# Patient Record
Sex: Male | Born: 1955 | Race: White | Hispanic: No | Marital: Married | Smoking: Never smoker
Health system: Southern US, Community
[De-identification: ages and names within clinical notes are randomized; demographics above are authoritative.]

## PROBLEM LIST (undated history)

## (undated) DIAGNOSIS — Z1211 Encounter for screening for malignant neoplasm of colon: Secondary | ICD-10-CM

## (undated) HISTORY — PX: TESTICLE SURGERY: SHX794

---

## 2020-08-08 ENCOUNTER — Other Ambulatory Visit: Payer: Self-pay

## 2020-08-08 ENCOUNTER — Emergency Department: Payer: BC Managed Care – PPO

## 2020-08-08 ENCOUNTER — Observation Stay: Payer: BC Managed Care – PPO

## 2020-08-08 ENCOUNTER — Observation Stay
Admission: EM | Admit: 2020-08-08 | Discharge: 2020-08-08 | Disposition: A | Discharge status: Home / Self Care | Payer: BC Managed Care – PPO | Attending: Internal Medicine | Admitting: Internal Medicine

## 2020-08-08 ENCOUNTER — Encounter: Payer: Self-pay | Admitting: Emergency Medicine

## 2020-08-08 ENCOUNTER — Observation Stay (HOSPITAL_BASED_OUTPATIENT_CLINIC_OR_DEPARTMENT_OTHER)
Admit: 2020-08-08 | Discharge: 2020-08-08 | Disposition: A | Discharge status: Home / Self Care | Payer: BC Managed Care – PPO | Attending: Internal Medicine | Admitting: Internal Medicine

## 2020-08-08 DIAGNOSIS — Z20822 Contact with and (suspected) exposure to covid-19: Secondary | ICD-10-CM | POA: Diagnosis not present

## 2020-08-08 DIAGNOSIS — R4701 Aphasia: Principal | ICD-10-CM | POA: Insufficient documentation

## 2020-08-08 DIAGNOSIS — I6389 Other cerebral infarction: Secondary | ICD-10-CM | POA: Diagnosis not present

## 2020-08-08 DIAGNOSIS — R29818 Other symptoms and signs involving the nervous system: Secondary | ICD-10-CM | POA: Diagnosis not present

## 2020-08-08 DIAGNOSIS — I639 Cerebral infarction, unspecified: Secondary | ICD-10-CM

## 2020-08-08 DIAGNOSIS — E785 Hyperlipidemia, unspecified: Secondary | ICD-10-CM | POA: Diagnosis not present

## 2020-08-08 DIAGNOSIS — E7800 Pure hypercholesterolemia, unspecified: Secondary | ICD-10-CM

## 2020-08-08 DIAGNOSIS — G459 Transient cerebral ischemic attack, unspecified: Secondary | ICD-10-CM | POA: Diagnosis not present

## 2020-08-08 LAB — CBC
HCT: 43.2 % (ref 39.0–52.0)
Hemoglobin: 14.3 g/dL (ref 13.0–17.0)
MCH: 30.4 pg (ref 26.0–34.0)
MCHC: 33.1 g/dL (ref 30.0–36.0)
MCV: 91.9 fL (ref 80.0–100.0)
Platelets: 171 10*3/uL (ref 150–400)
RBC: 4.7 MIL/uL (ref 4.22–5.81)
RDW: 12.6 % (ref 11.5–15.5)
WBC: 5.4 10*3/uL (ref 4.0–10.5)
nRBC: 0 % (ref 0.0–0.2)

## 2020-08-08 LAB — ECHOCARDIOGRAM COMPLETE
AR max vel: 1.89 cm2
AV Peak grad: 6 mmHg
Ao pk vel: 1.22 m/s
Area-P 1/2: 3.74 cm2
Height: 69 in
S' Lateral: 3.25 cm
Weight: 2480 oz

## 2020-08-08 LAB — LIPID PANEL
Cholesterol: 254 mg/dL — ABNORMAL HIGH (ref 0–200)
HDL: 75 mg/dL (ref >40)
LDL Cholesterol: 164 mg/dL — ABNORMAL HIGH (ref 0–99)
Total CHOL/HDL Ratio: 3.4 RATIO
Triglycerides: 76 mg/dL (ref <150)
VLDL: 15 mg/dL (ref 0–40)

## 2020-08-08 LAB — DIFFERENTIAL
Abs Immature Granulocytes: 0.01 10*3/uL (ref 0.00–0.07)
Basophils Absolute: 0 10*3/uL (ref 0.0–0.1)
Basophils Relative: 1 %
Eosinophils Absolute: 0.2 10*3/uL (ref 0.0–0.5)
Eosinophils Relative: 3 %
Immature Granulocytes: 0 %
Lymphocytes Relative: 31 %
Lymphs Abs: 1.7 10*3/uL (ref 0.7–4.0)
Monocytes Absolute: 0.6 10*3/uL (ref 0.1–1.0)
Monocytes Relative: 11 %
Neutro Abs: 2.9 10*3/uL (ref 1.7–7.7)
Neutrophils Relative %: 54 %

## 2020-08-08 LAB — COMPREHENSIVE METABOLIC PANEL
ALT: 21 U/L (ref 0–44)
AST: 22 U/L (ref 15–41)
Albumin: 3.9 g/dL (ref 3.5–5.0)
Alkaline Phosphatase: 65 U/L (ref 38–126)
Anion gap: 8 (ref 5–15)
BUN: 26 mg/dL — ABNORMAL HIGH (ref 8–23)
CO2: 28 mmol/L (ref 22–32)
Calcium: 8.8 mg/dL — ABNORMAL LOW (ref 8.9–10.3)
Chloride: 101 mmol/L (ref 98–111)
Creatinine, Ser: 1.09 mg/dL (ref 0.61–1.24)
GFR calc Af Amer: >60 mL/min (ref >60)
GFR calc non Af Amer: >60 mL/min (ref >60)
Glucose, Bld: 118 mg/dL — ABNORMAL HIGH (ref 70–99)
Potassium: 3.8 mmol/L (ref 3.5–5.1)
Sodium: 137 mmol/L (ref 135–145)
Total Bilirubin: 0.7 mg/dL (ref 0.3–1.2)
Total Protein: 6.3 g/dL — ABNORMAL LOW (ref 6.5–8.1)

## 2020-08-08 LAB — PROTIME-INR
INR: 0.9 (ref 0.8–1.2)
Prothrombin Time: 11.7 seconds (ref 11.4–15.2)

## 2020-08-08 LAB — ETHANOL: Alcohol, Ethyl (B): <10 mg/dL (ref <10)

## 2020-08-08 LAB — SARS CORONAVIRUS 2 BY RT PCR (HOSPITAL ORDER, PERFORMED IN ~~LOC~~ HOSPITAL LAB): SARS Coronavirus 2: NEGATIVE

## 2020-08-08 LAB — HEMOGLOBIN A1C
Hgb A1c MFr Bld: 5.7 % — ABNORMAL HIGH (ref 4.8–5.6)
Mean Plasma Glucose: 116.89 mg/dL

## 2020-08-08 LAB — APTT: aPTT: 26 seconds (ref 24–36)

## 2020-08-08 MED ORDER — ACETAMINOPHEN 650 MG RE SUPP: 650.0000 mg | RECTAL | Status: DC | PRN

## 2020-08-08 MED ORDER — ENOXAPARIN SODIUM 40 MG/0.4ML ~~LOC~~ SOLN
40.0000 mg | SUBCUTANEOUS | Status: DC
  Administered 2020-08-08: 40 mg via SUBCUTANEOUS
  Filled 2020-08-08: qty 0.4

## 2020-08-08 MED ORDER — IOHEXOL 350 MG/ML SOLN
75.0000 mL | Freq: Once | INTRAVENOUS | Status: AC | PRN
  Administered 2020-08-08: 75 mL via INTRAVENOUS

## 2020-08-08 MED ORDER — SODIUM CHLORIDE 0.9 % IV SOLN: INTRAVENOUS | Status: DC

## 2020-08-08 MED ORDER — ATORVASTATIN CALCIUM 20 MG PO TABS
80.0000 mg | ORAL_TABLET | Freq: Every day | ORAL | Status: DC
  Administered 2020-08-08: 80 mg via ORAL
  Filled 2020-08-08: qty 4

## 2020-08-08 MED ORDER — STROKE: EARLY STAGES OF RECOVERY BOOK: Freq: Once | Status: DC

## 2020-08-08 MED ORDER — ATORVASTATIN CALCIUM 80 MG PO TABS: 80.0000 mg | ORAL_TABLET | Freq: Every day | ORAL | 0 refills | Status: AC

## 2020-08-08 MED ORDER — ATORVASTATIN CALCIUM 80 MG PO TABS
80.0000 mg | ORAL_TABLET | Freq: Every day | ORAL | 0 refills | Status: DC
Start: 2020-08-09 — End: 2020-08-08

## 2020-08-08 MED ORDER — IOHEXOL 300 MG/ML  SOLN: 75.0000 mL | Freq: Once | INTRAMUSCULAR | Status: DC | PRN

## 2020-08-08 MED ORDER — ACETAMINOPHEN 325 MG PO TABS: 650.0000 mg | ORAL_TABLET | ORAL | Status: DC | PRN

## 2020-08-08 MED ORDER — ACETAMINOPHEN 160 MG/5ML PO SOLN
650.0000 mg | ORAL | Status: DC | PRN
  Filled 2020-08-08: qty 20.3

## 2020-08-08 MED ORDER — SENNOSIDES-DOCUSATE SODIUM 8.6-50 MG PO TABS: 1.0000 | ORAL_TABLET | Freq: Every evening | ORAL | Status: DC | PRN

## 2020-08-08 MED ORDER — ASPIRIN EC 81 MG PO TBEC: 81.0000 mg | DELAYED_RELEASE_TABLET | Freq: Every day | ORAL | Status: AC

## 2020-08-08 MED ORDER — ASPIRIN 81 MG PO CHEW
324.0000 mg | CHEWABLE_TABLET | Freq: Once | ORAL | Status: AC
  Administered 2020-08-08: 324 mg via ORAL
  Filled 2020-08-08: qty 4

## 2020-08-08 NOTE — ED Notes (Signed)
Patient transported to CT 

## 2020-08-08 NOTE — ED Notes (Signed)
Breakfast tray ordered 

## 2020-08-08 NOTE — Progress Notes (Signed)
Chaplain responded to a referral request from [facility]. Chaplain conulted with RN prior to visit. Chaplain actively listened to Bryan Meyer describe his frighful experience of not being able to speak on Sunday morning around 3:00 AM.       Bryan Meyer spoke of traveling to the area from [place], [place] to help his mother with some projects around her home. Bryan Meyer indicated he had been busy on Saturday but did not feel as if it should have impacted his health. Bryan Meyer expressed a desire for further testing to determine his health issue.   Bryan Meyer spoke fondly of his supportive wife and mother. Wife had recently left the hospital.   Bryan Meyer asked Chaplain about his faith tradition indicating he is [place] and values prayer. Chaplain prayed for Bryan Meyer.   Bryan Meyer, MDiv Staff Chaplain, Relief

## 2020-08-08 NOTE — ED Notes (Signed)
Pt alert and oriented. Would like to be discharged.  Sent message to dr Myriam Forehand informing him of this.

## 2020-08-08 NOTE — Progress Notes (Signed)
*  PRELIMINARY RESULTS* Echocardiogram 2D Echocardiogram has been performed.  Bryan Meyer [name] 08/08/2020, 1:15 PM

## 2020-08-08 NOTE — H&P (Signed)
History and Physical    Summit Gibby TWK:[id number] DOB: Apr 16, 1956 DOA: 08/08/2020  PCP: No primary care provider on file.   Patient coming from: Home  I have personally briefly reviewed patient's old medical records in [facility]  Chief Complaint: Unable to speak  HPI: Bryan Meyer is a 64 y.o. male with no significant past medical history who presents to the emergency room after he noticed that he was having difficulty speaking. Patient was in his usual state of health when he went to bed last night at 9 PM and awoke around 1:15 AM noticing that he had difficulty getting his words out. He denied headache vision changes difficulty swallowing one-sided weakness numbness or tingling.  He states that he was working all day on an awning , standing on a step ladder with his head extended backward. ED Course: On arrival, his symptoms have for the most part resolved, code stroke was initiated. CT head showed no acute intracranial findings. Blood work showed mild cholesterol elevation but otherwise unremarkable. Telemetry neurology consulted at which time patient had an NIH stroke score of 0. Recommendation for MRI brain and MRA head and neck with contrast as well as TTE. Neuro to follow. Hospitalist consulted for admission EKG: My independent interpretation : Normal sinus rhythm with no acute ST-T wave changes  Review of Systems: As per HPI otherwise all other systems on review of systems negative.    History reviewed. No pertinent past medical history.  Past Surgical History:  Procedure Laterality Date  . TESTICLE SURGERY       reports that he has never smoked. He has never used smokeless tobacco. He reports previous alcohol use. He reports previous drug use.  No Known Allergies  History reviewed. No pertinent family history.    Prior to Admission medications   Not on File    Physical Exam: Vitals:   08/08/20 0232  Weight: 70.3 kg  Height: 5\' 9"  (1.753 m)      Vitals:   08/08/20 0232  Weight: 70.3 kg  Height: 5\' 9"  (1.753 m)      Constitutional: Alert and oriented x 3 . Not in any apparent distress HEENT:      Head: Normocephalic and atraumatic.         Eyes: PERLA, EOMI, Conjunctivae are normal. Sclera is non-icteric.       Mouth/Throat: Mucous membranes are moist.       Neck: Supple with no signs of meningismus. Cardiovascular: Regular rate and rhythm. No murmurs, gallops, or rubs. 2+ symmetrical distal pulses are present . No JVD. No LE edema Respiratory: Respiratory effort normal .Lungs sounds clear bilaterally. No wheezes, crackles, or rhonchi.  Gastrointestinal: Soft, non tender, and non distended with positive bowel sounds. No rebound or guarding. Genitourinary: No CVA tenderness. Musculoskeletal: Nontender with normal range of motion in all extremities. No cyanosis, or erythema of extremities. Neurologic:  Face is symmetric. Moving all extremities. No gross focal neurologic deficits . Skin: Skin is warm, dry.  No rash or ulcers Psychiatric: Mood and affect are normal    Labs on Admission: I have personally reviewed following labs and imaging studies  CBC: Recent Labs  Lab 08/08/20 0235  WBC 5.4  NEUTROABS 2.9  HGB 14.3  HCT 43.2  MCV 91.9  PLT 171   Basic Metabolic Panel: Recent Labs  Lab 08/08/20 0235  NA 137  K 3.8  CL 101  CO2 28  GLUCOSE 118*  BUN 26*  CREATININE 1.09  CALCIUM 8.8*  GFR: Estimated Creatinine Clearance: 68.1 mL/min (by C-G formula based on SCr of 1.09 mg/dL). Liver Function Tests: Recent Labs  Lab 08/08/20 0235  AST 22  ALT 21  ALKPHOS 65  BILITOT 0.7  PROT 6.3*  ALBUMIN 3.9   No results for input(s): LIPASE, AMYLASE in the last 168 hours. No results for input(s): AMMONIA in the last 168 hours. Coagulation Profile: Recent Labs  Lab 08/08/20 0235  INR 0.9   Cardiac Enzymes: No results for input(s): CKTOTAL, CKMB, CKMBINDEX, TROPONINI in the last 168 hours. BNP  (last 3 results) No results for input(s): PROBNP in the last 8760 hours. HbA1C: No results for input(s): HGBA1C in the last 72 hours. CBG: No results for input(s): GLUCAP in the last 168 hours. Lipid Profile: Recent Labs    08/08/20 0235  CHOL 254*  HDL 75  LDLCALC 164*  TRIG 76  CHOLHDL 3.4   Thyroid Function Tests: No results for input(s): TSH, T4TOTAL, FREET4, T3FREE, THYROIDAB in the last 72 hours. Anemia Panel: No results for input(s): VITAMINB12, FOLATE, FERRITIN, TIBC, IRON, RETICCTPCT in the last 72 hours. Urine analysis: No results found for: COLORURINE, APPEARANCEUR, LABSPEC, PHURINE, GLUCOSEU, HGBUR, BILIRUBINUR, KETONESUR, PROTEINUR, UROBILINOGEN, NITRITE, LEUKOCYTESUR  Radiological Exams on Admission: CT HEAD CODE STROKE WO CONTRAST  Result Date: 08/08/2020 CLINICAL DATA:  Code stroke.  Sudden onset aphasia EXAM: CT HEAD WITHOUT CONTRAST TECHNIQUE: Contiguous axial images were obtained from the base of the skull through the vertex without intravenous contrast. COMPARISON:  None. FINDINGS: Brain: There is no mass, hemorrhage or extra-axial collection. The size and configuration of the ventricles and extra-axial CSF spaces are normal. The brain parenchyma is normal, without evidence of acute or chronic infarction. Vascular: No abnormal hyperdensity of the major intracranial arteries or dural venous sinuses. No intracranial atherosclerosis. Skull: The visualized skull base, calvarium and extracranial soft tissues are normal. Sinuses/Orbits: No fluid levels or advanced mucosal thickening of the visualized paranasal sinuses. No mastoid or middle ear effusion. The orbits are normal. ASPECTS [facility] Stroke Program Early CT Score) - Ganglionic level infarction (caudate, lentiform nuclei, internal capsule, insula, M1-M3 cortex): 7 - Supraganglionic infarction (M4-M6 cortex): 3 Total score (0-10 with 10 being normal): 10 IMPRESSION: 1. Normal head CT. 2. ASPECTS is 10. These results  were called by telephone at the time of interpretation on 08/08/2020 at 2:51 am to provider [facility] , who verbally acknowledged these results. Electronically Signed   By: Deatra Robinson M.D.   On: 08/08/2020 02:51     Assessment/Plan 64 year old male with no significant past medical history presenting with an acute episode of aphasia, with complete resolution by arrival    Acute focal neurologic deficit with aphasia with complete resolution -CT head no acute intracranial deficit.  CTA head and neck normal -patient was seen by telemetry neurology -follow MRI brain  -echocardiogram -telemetry monitoring to evaluate for arrhythmias -aspirin and statins -consult neuro in the a.m. to follow    Hyperlipidemia -elevated total cholesterol and LDL -initiate statin    DVT prophylaxis: Lovenox  Code Status: full code  Family Communication:  none  Disposition Plan: Back to previous home environment Consults called: none  Status: Observation    Andris Baumann MD [facility]     08/08/2020, 3:44 AM

## 2020-08-08 NOTE — Consult Note (Signed)
[facility] TeleNeurology Consult Services  Stat Consult  Date of Service:   08/08/2020 02:53:39  Impression:     .  R47.01 - Aphasia  Comments/Sign-Out: 6 M, no PMH, here with a 5-10 min spell of word finding difficulty concerning for expressive aphasia. Head CT neg for acute abnl. NIHSS 0 now.  PLAN  - MRI brain and MRA head/neck w/o contrast  - TTE  - check a1c and LDL  - monitor on tele for afib  - neuro to follow  --  CT HEAD: Showed No Acute Hemorrhage or Acute Core Infarct  Metrics: [facility] Notification Time: 08/08/2020 02:51:02 Stamp Time: 08/08/2020 02:53:39 Callback Response Time: 08/08/2020 02:54:12  ----------------------------------------------------------------------------------------------------  Chief Complaint: word finding difficulty  History of Present Illness: Patient is a 64 year old Male.  40 M, no relevant PMH, who was LKW at 9 PM last night when he went to bed. He awoke at 1:15 AM with diaphoresis. When he went to take his dog out, he realized he couldn't call its name and then he woke his wife up and had trouble expressing himself. He had a 5-10 mins spell of word finding difficulty. Came to ER for eval. NIHSS 0. He feels 95% back to baseline. No history of stroke in the past. Head CT neg for acute abnl.   Examination: 1A: Level of Consciousness - Alert; keenly responsive + 0 1B: Ask Month and Age - Both Questions Right + 0 1C: Blink Eyes & Squeeze Hands - Performs Both Tasks + 0 2: Test Horizontal Extraocular Movements - Normal + 0 3: Test Visual Fields - No Visual Loss + 0 4: Test Facial Palsy (Use Grimace if Obtunded) - Normal symmetry + 0 5A: Test Left Arm Motor Drift - No Drift for 10 Seconds + 0 5B: Test Right Arm Motor Drift - No Drift for 10 Seconds + 0 6A: Test Left Leg Motor Drift - No Drift for 5 Seconds + 0 6B: Test Right Leg Motor Drift - No Drift for 5 Seconds + 0 7: Test Limb Ataxia  (FNF/Heel-Shin) - No Ataxia + 0 8: Test Sensation - Normal; No sensory loss + 0 9: Test Language/Aphasia - Normal; No aphasia + 0 10: Test Dysarthria - Normal + 0 11: Test Extinction/Inattention - No abnormality + 0  NIHSS Score: 0    Patient is being evaluated for possible acute neurologic impairment and high probability of imminent or life-threatening deterioration. I spent total of 20 minutes providing care to this patient, including time for face to face visit via telemedicine, review of medical records, imaging studies and discussion of findings with providers, the patient and/or family.   Dr Othelia Pulling   [facility] [phone]  Case [id number]

## 2020-08-08 NOTE — ED Notes (Signed)
Per charge/MD pt can be held in ED until discharge pending echo.

## 2020-08-08 NOTE — ED Notes (Signed)
Assisted to bathroom

## 2020-08-08 NOTE — ED Notes (Signed)
Echo being done at bedside.

## 2020-08-08 NOTE — Discharge Summary (Signed)
Physician Discharge Summary  Kosei Rhodes POE:[id number] DOB: 1956/10/29 DOA: 08/08/2020  PCP: Patient, No Pcp Per  Admit date: 08/08/2020 Discharge date: 08/08/2020  Discharge disposition: Home   Recommendations for Outpatient Follow-Up:   Follow-up with PCP in 1 week   Discharge Diagnosis:   Active Problems:   Acute focal neurologic deficit with partial resolution   Hyperlipidemia    Discharge Condition: Stable.  Diet recommendation:  Diet Order            Diet Heart Room service appropriate? Yes; Fluid consistency: Thin  Diet effective now           Diet - low sodium heart healthy                   Code Status: Full Code     Hospital Course:   Mr. Labrandon Knoch  is a 64 year old with no significant medical history who presented to the hospital because of difficulty speaking.  He said he was fine when he went to bed last night.  However, he woke up around 1:15 AM and noticed that he could not get his words out.  EMS arrived about 13 minutes later and apparently all his vital signs were normal but he was recommended that he come to the ED.  He said by the time he got to the ED, his speech had returned to normal.  Did not have any unilateral numbness, tingling or weakness in the extremities, no changes in vision, no headache, dizziness or confusion.  CT head, MRI brain, CTA head and neck were all unremarkable.  Total cholesterol was 254, HDL was 75 and LDL was 164 consistent with hyperlipidemia.  2D echo was done and this was read by Dr. Lady Gary, cardiologist, who said that echo was okay.  His clinical features consistent with TIA.  He will be discharged on low-dose aspirin and high-dose Lipitor.  Diagnosis and discharge plan was discussed with the patient and his wife (who was on speaker phone) in detail.  Risks and benefits of newly prescribed medicines were discussed as well.    Medical Consultants:   Tele neurologist  Discharge Exam:    Vitals:    08/08/20 1100 08/08/20 1130 08/08/20 1200 08/08/20 1230  BP: 121/75 117/73 122/81 140/82  Pulse: 78 67 62 74  Resp: 19 18 17 16   Temp:      TempSrc:      SpO2: 99% 99% 98% 100%  Weight:      Height:         GEN: NAD SKIN: No rash EYES: EOMI ENT: MMM CV: RRR PULM: CTA B ABD: soft, ND, NT, +BS CNS: AAO x 3, non focal EXT: No edema or tenderness   The results of significant diagnostics from this hospitalization (including imaging, microbiology, ancillary and laboratory) are listed below for reference.     Procedures and Diagnostic Studies:   CT Angio Head W or Wo Contrast  Result Date: 08/08/2020 CLINICAL DATA:  Aphasia EXAM: CT ANGIOGRAPHY HEAD AND NECK TECHNIQUE: Multidetector CT imaging of the head and neck was performed using the standard protocol during bolus administration of intravenous contrast. Multiplanar CT image reconstructions and MIPs were obtained to evaluate the vascular anatomy. Carotid stenosis measurements (when applicable) are obtained utilizing NASCET criteria, using the distal internal carotid diameter as the denominator. CONTRAST:  14mL OMNIPAQUE IOHEXOL 350 MG/ML SOLN COMPARISON:  None. FINDINGS: CTA NECK FINDINGS SKELETON: There is no bony spinal canal stenosis. No lytic or blastic lesion. OTHER  NECK: Normal pharynx, larynx and major salivary glands. No cervical lymphadenopathy. Unremarkable thyroid gland. UPPER CHEST: No pneumothorax or pleural effusion. No nodules or masses. AORTIC ARCH: There is no calcific atherosclerosis of the aortic arch. There is no aneurysm, dissection or hemodynamically significant stenosis of the visualized portion of the aorta. Conventional 3 vessel aortic branching pattern. The visualized proximal subclavian arteries are widely patent. RIGHT CAROTID SYSTEM: Normal without aneurysm, dissection or stenosis. LEFT CAROTID SYSTEM: Normal without aneurysm, dissection or stenosis. VERTEBRAL ARTERIES: Left dominant configuration. Both origins  are clearly patent. There is no dissection, occlusion or flow-limiting stenosis to the skull base (V1-V3 segments). CTA HEAD FINDINGS POSTERIOR CIRCULATION: --Vertebral arteries: Normal V4 segments. --Inferior cerebellar arteries: Normal. --Basilar artery: Normal. --Superior cerebellar arteries: Normal. --Posterior cerebral arteries (PCA): Normal. ANTERIOR CIRCULATION: --Intracranial internal carotid arteries: Normal. --Anterior cerebral arteries (ACA): Normal. Both A1 segments are present. Patent anterior communicating artery (a-comm). --Middle cerebral arteries (MCA): Normal. VENOUS SINUSES: As permitted by contrast timing, patent. ANATOMIC VARIANTS: Fetal origin of the left posterior cerebral artery. Review of the MIP images confirms the above findings. IMPRESSION: Normal CTA of the head and neck. Electronically Signed   By: Deatra Robinson M.D.   On: 08/08/2020 03:47   DG Chest 2 View  Result Date: 08/08/2020 CLINICAL DATA:  Aphasia EXAM: CHEST - 2 VIEW COMPARISON:  None. FINDINGS: The heart size and mediastinal contours are within normal limits. Both lungs are clear. The visualized skeletal structures are unremarkable. IMPRESSION: No active cardiopulmonary disease. Electronically Signed   By: Deatra Robinson M.D.   On: 08/08/2020 05:32   CT Angio Neck W and/or Wo Contrast  Result Date: 08/08/2020 CLINICAL DATA:  Aphasia EXAM: CT ANGIOGRAPHY HEAD AND NECK TECHNIQUE: Multidetector CT imaging of the head and neck was performed using the standard protocol during bolus administration of intravenous contrast. Multiplanar CT image reconstructions and MIPs were obtained to evaluate the vascular anatomy. Carotid stenosis measurements (when applicable) are obtained utilizing NASCET criteria, using the distal internal carotid diameter as the denominator. CONTRAST:  70mL OMNIPAQUE IOHEXOL 350 MG/ML SOLN COMPARISON:  None. FINDINGS: CTA NECK FINDINGS SKELETON: There is no bony spinal canal stenosis. No lytic or blastic  lesion. OTHER NECK: Normal pharynx, larynx and major salivary glands. No cervical lymphadenopathy. Unremarkable thyroid gland. UPPER CHEST: No pneumothorax or pleural effusion. No nodules or masses. AORTIC ARCH: There is no calcific atherosclerosis of the aortic arch. There is no aneurysm, dissection or hemodynamically significant stenosis of the visualized portion of the aorta. Conventional 3 vessel aortic branching pattern. The visualized proximal subclavian arteries are widely patent. RIGHT CAROTID SYSTEM: Normal without aneurysm, dissection or stenosis. LEFT CAROTID SYSTEM: Normal without aneurysm, dissection or stenosis. VERTEBRAL ARTERIES: Left dominant configuration. Both origins are clearly patent. There is no dissection, occlusion or flow-limiting stenosis to the skull base (V1-V3 segments). CTA HEAD FINDINGS POSTERIOR CIRCULATION: --Vertebral arteries: Normal V4 segments. --Inferior cerebellar arteries: Normal. --Basilar artery: Normal. --Superior cerebellar arteries: Normal. --Posterior cerebral arteries (PCA): Normal. ANTERIOR CIRCULATION: --Intracranial internal carotid arteries: Normal. --Anterior cerebral arteries (ACA): Normal. Both A1 segments are present. Patent anterior communicating artery (a-comm). --Middle cerebral arteries (MCA): Normal. VENOUS SINUSES: As permitted by contrast timing, patent. ANATOMIC VARIANTS: Fetal origin of the left posterior cerebral artery. Review of the MIP images confirms the above findings. IMPRESSION: Normal CTA of the head and neck. Electronically Signed   By: Deatra Robinson M.D.   On: 08/08/2020 03:47   MR BRAIN WO CONTRAST  Result Date: 08/08/2020 CLINICAL  DATA:  Aphasia EXAM: MRI HEAD WITHOUT CONTRAST TECHNIQUE: Multiplanar, multiecho pulse sequences of the brain and surrounding structures were obtained without intravenous contrast. COMPARISON:  None. FINDINGS: Brain: No acute infarct, acute hemorrhage or extra-axial collection. Normal white matter signal.  Normal volume of CSF spaces. No chronic microhemorrhage. Normal midline structures. Vascular: Normal flow voids. Skull and upper cervical spine: Normal marrow signal. Sinuses/Orbits: Negative. Other: None. IMPRESSION: Normal aging brain. Electronically Signed   By: Deatra Robinson M.D.   On: 08/08/2020 05:28   CT HEAD CODE STROKE WO CONTRAST  Result Date: 08/08/2020 CLINICAL DATA:  Code stroke.  Sudden onset aphasia EXAM: CT HEAD WITHOUT CONTRAST TECHNIQUE: Contiguous axial images were obtained from the base of the skull through the vertex without intravenous contrast. COMPARISON:  None. FINDINGS: Brain: There is no mass, hemorrhage or extra-axial collection. The size and configuration of the ventricles and extra-axial CSF spaces are normal. The brain parenchyma is normal, without evidence of acute or chronic infarction. Vascular: No abnormal hyperdensity of the major intracranial arteries or dural venous sinuses. No intracranial atherosclerosis. Skull: The visualized skull base, calvarium and extracranial soft tissues are normal. Sinuses/Orbits: No fluid levels or advanced mucosal thickening of the visualized paranasal sinuses. No mastoid or middle ear effusion. The orbits are normal. ASPECTS [facility] Stroke Program Early CT Score) - Ganglionic level infarction (caudate, lentiform nuclei, internal capsule, insula, M1-M3 cortex): 7 - Supraganglionic infarction (M4-M6 cortex): 3 Total score (0-10 with 10 being normal): 10 IMPRESSION: 1. Normal head CT. 2. ASPECTS is 10. These results were called by telephone at the time of interpretation on 08/08/2020 at 2:51 am to provider [facility] , who verbally acknowledged these results. Electronically Signed   By: Deatra Robinson M.D.   On: 08/08/2020 02:51     Labs:   Basic Metabolic Panel: Recent Labs  Lab 08/08/20 0235  NA 137  K 3.8  CL 101  CO2 28  GLUCOSE 118*  BUN 26*  CREATININE 1.09  CALCIUM 8.8*   GFR Estimated Creatinine Clearance: 68.1 mL/min  (by C-G formula based on SCr of 1.09 mg/dL). Liver Function Tests: Recent Labs  Lab 08/08/20 0235  AST 22  ALT 21  ALKPHOS 65  BILITOT 0.7  PROT 6.3*  ALBUMIN 3.9   No results for input(s): LIPASE, AMYLASE in the last 168 hours. No results for input(s): AMMONIA in the last 168 hours. Coagulation profile Recent Labs  Lab 08/08/20 0235  INR 0.9    CBC: Recent Labs  Lab 08/08/20 0235  WBC 5.4  NEUTROABS 2.9  HGB 14.3  HCT 43.2  MCV 91.9  PLT 171   Cardiac Enzymes: No results for input(s): CKTOTAL, CKMB, CKMBINDEX, TROPONINI in the last 168 hours. BNP: Invalid input(s): POCBNP CBG: No results for input(s): GLUCAP in the last 168 hours. D-Dimer No results for input(s): DDIMER in the last 72 hours. Hgb A1c Recent Labs    08/08/20 0235  HGBA1C 5.7*   Lipid Profile Recent Labs    08/08/20 0235  CHOL 254*  HDL 75  LDLCALC 164*  TRIG 76  CHOLHDL 3.4   Thyroid function studies No results for input(s): TSH, T4TOTAL, T3FREE, THYROIDAB in the last 72 hours.  Invalid input(s): FREET3 Anemia work up No results for input(s): VITAMINB12, FOLATE, FERRITIN, TIBC, IRON, RETICCTPCT in the last 72 hours. Microbiology Recent Results (from the past 240 hour(s))  SARS Coronavirus 2 by RT PCR (hospital order, performed in [facility] hospital lab) Nasopharyngeal Nasopharyngeal Swab  Status: None   Collection Time: 08/08/20  2:41 AM   Specimen: Nasopharyngeal Swab  Result Value Ref Range Status   SARS Coronavirus 2 NEGATIVE NEGATIVE Final    Comment: (NOTE) SARS-CoV-2 target nucleic acids are NOT DETECTED.  The SARS-CoV-2 RNA is generally detectable in upper and lower respiratory specimens during the acute phase of infection. The lowest concentration of SARS-CoV-2 viral copies this assay can detect is 250 copies / mL. A negative result does not preclude SARS-CoV-2 infection and should not be used as the sole basis for treatment or other patient management  decisions.  A negative result may occur with improper specimen collection / handling, submission of specimen other than nasopharyngeal swab, presence of viral mutation(s) within the areas targeted by this assay, and inadequate number of viral copies (<250 copies / mL). A negative result must be combined with clinical observations, patient history, and epidemiological information.  Fact Sheet for Patients:   BoilerBrush.com.cyhttps://www.fda.gov/media/136312/download  Fact Sheet for Healthcare Providers: https://pope.com/https://www.fda.gov/media/136313/download  This test is not yet approved or  cleared by the [place] FDA and has been authorized for detection and/or diagnosis of SARS-CoV-2 by FDA under an Emergency Use Authorization (EUA).  This EUA will remain in effect (meaning this test can be used) for the duration of the COVID-19 declaration under Section 564(b)(1) of the Act, 21 U.S.C. section 360bbb-3(b)(1), unless the authorization is terminated or revoked sooner.  Performed at [facility], [address] [place], [place] [address]      Discharge Instructions:   Discharge Instructions    Diet - low sodium heart healthy   Complete by: As directed    Increase activity slowly   Complete by: As directed      Allergies as of 08/08/2020   No Known Allergies     Medication List    TAKE these medications   aspirin EC 81 MG tablet Take 1 tablet (81 mg total) by mouth daily. Swallow whole.   atorvastatin 80 MG tablet Commonly known as: LIPITOR Take 1 tablet (80 mg total) by mouth daily. Start taking on: August 09, 2020         Time coordinating discharge: 35 minutes  Signed:  Lurene ShadowBERNARD Adisson Deak  [facility] 08/08/2020, 1:23 PM   Pager on [email or web address]. If 7PM-7AM, please contact night-coverage at [email or web address]

## 2020-08-08 NOTE — ED Triage Notes (Addendum)
Patient brought in by ems from home. Patient states that he went to bed about 21:00. Patient states that when he woke up about 21:30 he was having trouble speech. Patient states that his speech has improved but not back to normal. Patient with symmetrical smile and no drift in arms or legs.   EMS vital signs hr 80, bp 156/97, 98% on room air, temp 97.6 and fsbs 114.

## 2020-08-08 NOTE — ED Notes (Signed)
Pt refusing to be moved to a room, states the MD "expidited" his echo so he could be discharged from the ER. Pt informed the echo has not been read, pt states he is disappointed and wants the doctor messaged. Charge rn notified. MD Tretha Sciara.

## 2020-08-08 NOTE — ED Notes (Signed)
Pt eating breakfast 

## 2020-08-08 NOTE — Progress Notes (Signed)
OT Cancellation Note  Patient Details Name: Bryan Meyer MRN: [id number] DOB: October 25, 1956   Cancelled Treatment:    Reason Eval/Treat Not Completed: OT screened, no needs identified, will sign off. Per conversation c PT, pt near baseline Independence c ADLs. No skilled acute OT needs identified. Will sign off at this time. Please re-consult if new needs arise.  Kathie Dike, M.S. OTR/L  08/08/20, 1:32 PM  ascom [phone]

## 2020-08-08 NOTE — Evaluation (Signed)
Physical Therapy Evaluation and Discharge Patient Details Name: Bryan Meyer MRN: [id number] DOB: [birth year] Today's Date: 08/08/2020   History of Present Illness  64 yo male with onset of speech difficulties getting words out and speaking clearly.  Was unable to call 911, had his mother speak into the phone to call.  Had been standing with head extended all day prior to onset of symptoms. All imaging of head and neck were WFL.  PMHx:  testicle surgery, HLD,   Clinical Impression  Pt was seen for mobility without AD, with recovery of all functional level to baseline.  He is given some standardized testing, and note his Berg balance score indicates no real fall risk.  Pt is asking about going home, and will recommend he not need any further PT care or equipment.  Discharge PT for now.    Follow Up Recommendations No PT follow up    Equipment Recommendations  None recommended by PT    Recommendations for Other Services       Precautions / Restrictions Precautions Precautions: None Precaution Comments: all vitals WFL Restrictions Weight Bearing Restrictions: No      Mobility  Bed Mobility Overal bed mobility: Independent                Transfers Overall transfer level: Independent               General transfer comment: no balance changes or complaints with any mobility  Ambulation/Gait Ambulation/Gait assistance: Supervision (for safety) Gait Distance (Feet): 60 Feet (in pt's room) Assistive device: None Gait Pattern/deviations: Step-through pattern;Wide base of support;Decreased stride length Gait velocity: normal Gait velocity interpretation: >2.62 ft/sec, indicative of community ambulatory General Gait Details: pt takes smaller stride but increased with certain walking tasks  Stairs            Wheelchair Mobility    Modified Rankin (Stroke Patients Only)       Balance Overall balance assessment: Independent                                Standardized Balance Assessment Standardized Balance Assessment : Berg Balance Test Berg Balance Test Sit to Stand: Able to stand without using hands and stabilize independently Standing Unsupported: Able to stand safely 2 minutes Sitting with Back Unsupported but Feet Supported on Floor or Stool: Able to sit safely and securely 2 minutes Stand to Sit: Sits safely with minimal use of hands Transfers: Able to transfer safely, minor use of hands Standing Unsupported with Eyes Closed: Able to stand 10 seconds safely Standing Ubsupported with Feet Together: Able to place feet together independently and stand 1 minute safely From Standing, Reach Forward with Outstretched Arm: Can reach confidently >25 cm (10") From Standing Position, Pick up Object from Floor: Able to pick up shoe safely and easily From Standing Position, Turn to Look Behind Over each Shoulder: Looks behind from both sides and weight shifts well Turn 360 Degrees: Able to turn 360 degrees safely in 4 seconds or less Standing Unsupported, Alternately Place Feet on Step/Stool: Able to stand independently and safely and complete 8 steps in 20 seconds Standing Unsupported, One Foot in Front: Able to plae foot ahead of the other independently and hold 30 seconds Standing on One Leg: Able to lift leg independently and hold > 10 seconds Total Score: 55         Pertinent Vitals/Pain Pain Assessment: No/denies pain  Home Living Family/patient expects to be discharged to:: Private residence Living Arrangements: Spouse/significant other Available Help at Discharge: Family;Available PRN/intermittently Type of Home: House Home Access: Level entry     Home Layout: Two level Home Equipment: None Additional Comments: has been driving interstate to help his mother, pt is from [place]    Prior Function Level of Independence: Independent         Comments: no falls, no recent incidents     Hand Dominance   Dominant  Hand: Right    Extremity/Trunk Assessment   Upper Extremity Assessment Upper Extremity Assessment: Overall WFL for tasks assessed    Lower Extremity Assessment Lower Extremity Assessment: Overall WFL for tasks assessed    Cervical / Trunk Assessment Cervical / Trunk Assessment: Normal  Communication   Communication: No difficulties  Cognition Arousal/Alertness: Awake/alert Behavior During Therapy: WFL for tasks assessed/performed Overall Cognitive Status: Within Functional Limits for tasks assessed                                        General Comments General comments (skin integrity, edema, etc.): pt is notably steady to do [employer]    Exercises     Assessment/Plan    PT Assessment Patent does not need any further PT services  PT Problem List Decreased range of motion;Decreased knowledge of use of DME       PT Treatment Interventions      PT Goals (Current goals can be found in the Care Plan section)  Acute Rehab PT Goals Patient Stated Goal: to get home PT Goal Formulation: With patient Time For Goal Achievement: 08/09/20 Potential to Achieve Goals: Good    Frequency     Barriers to discharge Inaccessible home environment;Decreased caregiver support home with wife out at times and has two story house    Co-evaluation               AM-PAC PT "6 Clicks" Mobility  Outcome Measure Help needed turning from your back to your side while in a flat bed without using bedrails?: None Help needed moving from lying on your back to sitting on the side of a flat bed without using bedrails?: None Help needed moving to and from a bed to a chair (including a wheelchair)?: None Help needed standing up from a chair using your arms (e.g., wheelchair or bedside chair)?: None Help needed to walk in hospital room?: None Help needed climbing 3-5 steps with a railing? : A Little 6 Click Score: 23    End of Session   Activity Tolerance: Patient  tolerated treatment well;Treatment limited secondary to medical complications (Comment) Patient left: in bed;with call bell/phone within reach Nurse Communication: Mobility status PT Visit Diagnosis: Other symptoms and signs involving the nervous system (W54.627)    Time: 0350-0938 PT Time Calculation (min) (ACUTE ONLY): 13 min   Charges:   PT Evaluation $PT Eval Moderate Complexity: 1 Mod         Ivar Drape 08/08/2020, 1:50 PM  Samul Dada, PT MS Acute Rehab Dept. Number: [facility] [id number] and [facility] [phone]

## 2020-08-08 NOTE — ED Provider Notes (Signed)
[facility] Emergency Department Provider Note  ____________________________________________   First MD Initiated Contact with Patient 08/08/20 0235     (approximate)  I have reviewed the triage vital signs and the nursing notes.   HISTORY  Chief Complaint Aphasia and Code Stroke   HPI Bryan Meyer is a 64 y.o. male without significant past medical history who presents via EMS from home for assessment of speech difficulty.  Patient states he went to sleep around 9 PM feeling completely normal without any difficulty speaking and woke around 1:15 AM with difficulty getting out his words.  Patient states he felt very warm and a little bit lightheaded.  He denies any vertigo, vision changes, headache, chest pain, cough, shortness of breath, nausea, vomiting, diarrhea, dysuria, focal extremity weakness numbness or tingling, difficulty ambulating, or other acute complaints.  He states he feels his speech has gradually improved since he first awoke and that is almost completely back to normal.  No prior similar episodes.  No alleviating or aggravating factors.  Denies EtOH use earlier this evening.  Denies being on any blood thinners.         History reviewed. No pertinent past medical history.  Patient Active Problem List   Diagnosis Date Noted  . Acute focal neurologic deficit with partial resolution 08/08/2020    Past Surgical History:  Procedure Laterality Date  . TESTICLE SURGERY      Prior to Admission medications   Not on File    Allergies Patient has no known allergies.  No family history on file.  Social History Social History   Tobacco Use  . Smoking status: Never Smoker  . Smokeless tobacco: Never Used  Substance Use Topics  . Alcohol use: Not Currently  . Drug use: Not Currently    Review of Systems  Review of Systems  Constitutional: Negative for chills and fever.  HENT: Negative for sore throat.   Eyes: Negative for  pain.  Respiratory: Negative for cough and stridor.   Cardiovascular: Negative for chest pain.  Gastrointestinal: Negative for vomiting.  Skin: Negative for rash.  Neurological: Positive for speech change. Negative for seizures, loss of consciousness and headaches.  Psychiatric/Behavioral: Negative for suicidal ideas.  All other systems reviewed and are negative.     ____________________________________________   PHYSICAL EXAM:  VITAL SIGNS: ED Triage Vitals [08/08/20 0232]  Enc Vitals Group     BP      Pulse      Resp      Temp      Temp src      SpO2      Weight 155 lb (70.3 kg)     Height 5\' 9"  (1.753 m)     Head Circumference      Peak Flow      Pain Score 0     Pain Loc      Pain Edu?      Excl. in GC?    There were no vitals filed for this visit. Physical Exam Vitals and nursing note reviewed.  Constitutional:      Appearance: He is well-developed.  HENT:     Head: Normocephalic and atraumatic.     Right Ear: External ear normal.     Left Ear: External ear normal.     Nose: Nose normal.     Mouth/Throat:     Mouth: Mucous membranes are moist.  Eyes:     Conjunctiva/sclera: Conjunctivae normal.  Cardiovascular:     Rate  and Rhythm: Normal rate and regular rhythm.     Heart sounds: No murmur heard.   Pulmonary:     Effort: Pulmonary effort is normal. No respiratory distress.     Breath sounds: Normal breath sounds.  Abdominal:     Palpations: Abdomen is soft.     Tenderness: There is no abdominal tenderness.  Musculoskeletal:     Cervical back: Neck supple.  Skin:    General: Skin is warm and dry.     Capillary Refill: Capillary refill takes less than 2 seconds.  Neurological:     Mental Status: He is alert and oriented to person, place, and time.     Comments: Cranial nerves II through XII grossly intact with exception of some slight deviation of the tongue to the left..  No pronator drift.  No finger dysmetria.  Patient has full and symmetric  strength in all extremities.  Sensation intact light touch to all extremities.  Psychiatric:        Mood and Affect: Mood normal.      ____________________________________________   LABS (all labs ordered are listed, but only abnormal results are displayed)  Labs Reviewed  COMPREHENSIVE METABOLIC PANEL - Abnormal; Notable for the following components:      Result Value   Glucose, Bld 118 (*)    BUN 26 (*)    Calcium 8.8 (*)    Total Protein 6.3 (*)    All other components within normal limits  LIPID PANEL - Abnormal; Notable for the following components:   Cholesterol 254 (*)    LDL Cholesterol 164 (*)    All other components within normal limits  SARS CORONAVIRUS 2 BY RT PCR (HOSPITAL ORDER, PERFORMED IN  HOSPITAL LAB)  ETHANOL  PROTIME-INR  APTT  CBC  DIFFERENTIAL  URINE DRUG SCREEN, QUALITATIVE ([facility] ONLY)  URINALYSIS, ROUTINE W REFLEX MICROSCOPIC  HEMOGLOBIN A1C  I-STAT CREATININE, ED  CBG MONITORING, ED   ____________________________________________  EKG  Sinus rhythm with a ventricular rate of 76, normal axis, unremarkable levels, no clear evidence of acute ischemia. ____________________________________________  RADIOLOGY   Official radiology report(s): CT HEAD CODE STROKE WO CONTRAST  Result Date: 08/08/2020 CLINICAL DATA:  Code stroke.  Sudden onset aphasia EXAM: CT HEAD WITHOUT CONTRAST TECHNIQUE: Contiguous axial images were obtained from the base of the skull through the vertex without intravenous contrast. COMPARISON:  None. FINDINGS: Brain: There is no mass, hemorrhage or extra-axial collection. The size and configuration of the ventricles and extra-axial CSF spaces are normal. The brain parenchyma is normal, without evidence of acute or chronic infarction. Vascular: No abnormal hyperdensity of the major intracranial arteries or dural venous sinuses. No intracranial atherosclerosis. Skull: The visualized skull base, calvarium and extracranial  soft tissues are normal. Sinuses/Orbits: No fluid levels or advanced mucosal thickening of the visualized paranasal sinuses. No mastoid or middle ear effusion. The orbits are normal. ASPECTS [facility] Stroke Program Early CT Score) - Ganglionic level infarction (caudate, lentiform nuclei, internal capsule, insula, M1-M3 cortex): 7 - Supraganglionic infarction (M4-M6 cortex): 3 Total score (0-10 with 10 being normal): 10 IMPRESSION: 1. Normal head CT. 2. ASPECTS is 10. These results were called by telephone at the time of interpretation on 08/08/2020 at 2:51 am to provider [facility] , who verbally acknowledged these results. Electronically Signed   By: Deatra Robinson M.D.   On: 08/08/2020 02:51    ____________________________________________   PROCEDURES  Procedure(s) performed (including Critical Care):  .1-3 Lead EKG Interpretation Performed by: Katrinka Blazing,  Zerita Boers, MD Authorized by: Gilles Chiquito, MD     Interpretation: normal     ECG rate assessment: normal     Rhythm: sinus rhythm     Ectopy: none     Conduction: normal       ____________________________________________   INITIAL IMPRESSION / ASSESSMENT AND PLAN / ED COURSE        Patient presents with [place] to history exam for assessment after an episode of difficulty getting his words that he noticed when he woke up at 1:15 AM today.  Last known normal was around 9 PM yesterday.  Patient is only subtle tongue deviation on exam but no other focal deficits.  In addition patient is a speaking normally and does not have any slurred speech.  Patient is afebrile hemodynamically stable.  Remainder of exam as above.  CT head Noncon immediately obtained unremarkable for evidence of intracranial bleeding.  Stat neurology consult also obtained due to concern for possible TIA.  Per neurology recommendation from Dr. Nedra Hai concern for TIA with recommendation for hospitalist admission for TIA work-up.  This does seem likely as history and exam is  otherwise not consistent with acute infectious process, recent traumatic injury, significant metabolic derangement, or acute intoxication.  Will admit to hospitalist service for further evaluation management.  CT head and neck ordered while patient in ED.   ____________________________________________   FINAL CLINICAL IMPRESSION(S) / ED DIAGNOSES  Final diagnoses:  TIA (transient ischemic attack)  Expressive aphasia    Medications  aspirin chewable tablet 324 mg (has no administration in time range)  iohexol (OMNIPAQUE) 350 MG/ML injection 75 mL (75 mLs Intravenous Contrast Given 08/08/20 0327)     ED Discharge Orders    None       Note:  This document was prepared using Dragon voice recognition software and may include unintentional dictation errors.   Gilles Chiquito, MD 08/08/20 [phone]

## 2020-08-08 NOTE — ED Notes (Signed)
Per MD pt can be discharged.

## 2021-11-02 ENCOUNTER — Inpatient Hospital Stay: Payer: BLUE CROSS/BLUE SHIELD

## 2021-11-02 MED ORDER — SODIUM CHLORIDE 0.9 % IV
INTRAVENOUS | Status: DC | PRN
Start: 2021-11-02 — End: 2021-11-02
  Administered 2021-11-02: 13:00:00 via INTRAVENOUS

## 2021-11-02 MED ORDER — MIDAZOLAM 1 MG/ML IJ SOLN
1 mg/mL | INTRAMUSCULAR | Status: DC | PRN
Start: 2021-11-02 — End: 2021-11-02

## 2021-11-02 MED ORDER — SIMETHICONE 40 MG/0.6 ML ORAL DROPS, SUSP
40 mg/0.6 mL | ORAL | Status: DC | PRN
Start: 2021-11-02 — End: 2021-11-02

## 2021-11-02 MED ORDER — PROPOFOL 10 MG/ML IV EMUL
10 mg/mL | INTRAVENOUS | Status: DC | PRN
Start: 2021-11-02 — End: 2021-11-02
  Administered 2021-11-02: 13:00:00 via INTRAVENOUS

## 2021-11-02 MED ORDER — NALOXONE 0.4 MG/ML INJECTION
0.4 mg/mL | INTRAMUSCULAR | Status: DC | PRN
Start: 2021-11-02 — End: 2021-11-02

## 2021-11-02 MED ORDER — FLUMAZENIL 0.1 MG/ML IV SOLN
0.1 mg/mL | INTRAVENOUS | Status: DC | PRN
Start: 2021-11-02 — End: 2021-11-02

## 2021-11-02 MED ORDER — FENTANYL CITRATE (PF) 50 MCG/ML IJ SOLN
50 mcg/mL | INTRAMUSCULAR | Status: DC | PRN
Start: 2021-11-02 — End: 2021-11-02

## 2021-11-02 MED ORDER — SODIUM CHLORIDE 0.9 % IV
INTRAVENOUS | Status: DC
Start: 2021-11-02 — End: 2021-11-02

## 2021-11-02 MED ORDER — PROPOFOL 10 MG/ML IV EMUL
10 mg/mL | INTRAVENOUS | Status: DC | PRN
Start: 2021-11-02 — End: 2021-11-02
  Administered 2021-11-02 (×2): via INTRAVENOUS

## 2021-11-02 MED FILL — SODIUM CHLORIDE 0.9 % IV: INTRAVENOUS | Qty: 250

## 2021-11-02 NOTE — Anesthesia Pre-Procedure Evaluation (Signed)
Relevant Problems   No relevant active problems       Anesthetic History   No history of anesthetic complications            Review of Systems / Medical History  Patient summary reviewed, nursing notes reviewed and pertinent labs reviewed    Pulmonary  Within defined limits                 Neuro/Psych       CVA  TIA     Cardiovascular  Within defined limits                     GI/Hepatic/Renal  Within defined limits              Endo/Other  Within defined limits           Other Findings              Physical Exam    Airway  Mallampati: II  TM Distance: 4 - 6 cm  Neck ROM: normal range of motion   Mouth opening: Normal     Cardiovascular    Rhythm: regular  Rate: normal         Dental  No notable dental hx       Pulmonary  Breath sounds clear to auscultation               Abdominal  Abdominal exam normal       Other Findings            Anesthetic Plan    ASA: 2  Anesthesia type: MAC          Induction: Intravenous

## 2021-11-02 NOTE — Anesthesia Post-Procedure Evaluation (Signed)
Procedure(s):  COLONOSCOPY.    MAC    Anesthesia Post Evaluation      Multimodal analgesia: multimodal analgesia not used between 6 hours prior to anesthesia start to PACU discharge  Patient location during evaluation: bedside  Level of consciousness: awake  Pain score: 0  Pain management: satisfactory to patient  Airway patency: patent  Anesthetic complications: no  Cardiovascular status: acceptable  Respiratory status: acceptable  Hydration status: acceptable  Post anesthesia nausea and vomiting:  controlled  Final Post Anesthesia Temperature Assessment:  Normothermia (36.0-37.5 degrees C)      INITIAL Post-op Vital signs:   Vitals Value Taken Time   BP 113/68 11/02/21 0855   Temp 36.6 ??C (97.8 ??F) 11/02/21 0834   Pulse 68 11/02/21 0857   Resp 14 11/02/21 0857   SpO2 97 % 11/02/21 0856   Vitals shown include unvalidated device data.

## 2021-11-02 NOTE — Progress Notes (Signed)
Endoscopy discharge instructions have been reviewed and given to patient.  The patient verbalized understanding and acceptance of instructions.      Dr. Lyons  discussed with family  procedure findings and next steps.

## 2021-11-02 NOTE — H&P (Signed)
65 y.o. male for open access colonoscopy for screening   Additional data for completion of the targeted pre-endoscopy H&P will be provided under 'H&P interval notes'.  Please see that document which will be attached to this.  Leta Speller, MD  Last 2012 Robertson negative.

## 2021-11-02 NOTE — Progress Notes (Signed)
Nell Bohl  1956-01-03  [id number]    Situation:  Verbal report received from: Suzy,RN  Procedure: Procedure(s):  COLONOSCOPY    Background:    Preoperative diagnosis: SCREENING  Postoperative diagnosis: normal colon    Operator:  Dr. Nunzio Cory  Assistant(s): Endoscopy Technician-1: Jonna Clark  Endoscopy RN-1: Adrian Blackwater, RN  Float Staff: Blima Rich    Specimens: * No specimens in log *  H. Pylori  no    Assessment:  Anesthesia gave intra-procedure sedation and medications, see anesthesia flow sheet yes    Intravenous fluids: NS@ KVO     Vital signs stable yes    Abdominal assessment: round and soft yes    Recommendation:  Discharge patient per MD order yes.  Return to floor na  Family or Friend family  Permission to share finding with family or friend yes

## 2021-11-02 NOTE — Interval H&P Note (Signed)
1962  Timeout performed.  Anesthesia staff at patient's bedside administering anesthesia and monitoring patients vital signs throughout procedure. See anesthesia note. Post procedure, report received from CRNA,    Jomarie Longs byram    0830  Endoscope was pre-cleaned at bedside immediately following procedure by endo tech,    Cherlynn Polo      [phone]  Patient tolerated procedure.   Abdomen soft and patient arousable and voices no complaints.   Patient transported to endoscopy recovery area.   Report given to post procedure RN,   Vance Gather bottoms

## 2021-11-02 NOTE — Procedures (Signed)
Procedures by Leta Speller, MD at 11/02/21 [id number]                Author: Leta Speller, MD  Service: Gastroenterology  Author Type: Physician       Filed: 11/02/21 0832  Date of Service: 11/02/21 0832  Status: Signed          Editor: Leta Speller, MD (Physician)            Procedures        1. COLONOSCOPY [[id number] (Custom)]                                                         [facility]   [facility] D. Nunzio Cory, MD   [phone]        November 02, 2021      Colonoscopy Procedure Note   Brian Franklin   DOB:  [birth year]   [facility] Medical Record Number: [id number]      Indications:     Screening colonoscopy   PCP:  Blair Dolphin, MD   Anesthesia/Sedation: Conscious Sedation/Moderate Sedation/GETA, see notes   Endoscopist:  Dr. Theodoro Parma   Complications:  None   Estimated Blood Loss:  None      Permit:   The indications, risks, benefits and alternatives were reviewed with the patient or their decision maker who was provided an opportunity to ask questions and all questions were answered.  The specific  risks of colonoscopy with conscious sedation were reviewed, including but not limited to anesthetic complication, bleeding, adverse drug reaction, missed lesion, infection, IV site reactions, and intestinal perforation which would lead to the need for  surgical repair.  Alternatives to colonoscopy including radiographic imaging, observation without testing, or laboratory testing were reviewed including the limitations of those alternatives.  After considering the options and having all their questions  answered, the patient or their decision maker provided both verbal and written consent to proceed.           Procedure in Detail:   After obtaining informed consent, positioning of the patient in the left lateral decubitus position, and conduction of a pre-procedure pause or "time out" the endoscope was introduced  into the anus  and advanced to the cecum, which was identified by the ileocecal valve and appendiceal orifice.  The quality of the colonic preparation was good.  A careful inspection was made as the colonoscope was withdrawn, findings and interventions are described  below.      Findings:    normal      Specimens:     none      Complications:    None; patient tolerated the procedure well.      Impression:   Normal colonoscopy to the cecum, with no evidence of neoplasia, diverticular disease, or mucosal abnormality.      Recommendations:      - For colon cancer screening in this average-risk patient, colonoscopy may be repeated in 10 years.      Thank you for entrusting me with this patient's care.  Please do not hesitate to contact me with any questions or if I can be of assistance with any of your other patients' GI needs.      Signed By: Leta Speller, MD  November 02, 2021         Surgical assistant none.  Implants none unless specified.

## 2021-12-17 IMAGING — MR MR HEAD W/O CM
11 series · 44 of 48 positions shown · non-contrast
Comparison: None.

CLINICAL DATA: Aphasia

EXAM:
MRI HEAD WITHOUT CONTRAST
TECHNIQUE: Multiplanar, multiecho pulse sequences of the brain and surrounding
structures were obtained without intravenous contrast.

[Series 5: ax dwi_tracew · axial · 3.0mm · 0.60mm/px · z∈[-76,+72]mm · 4 of 48 slices shown]
[im 1/48]
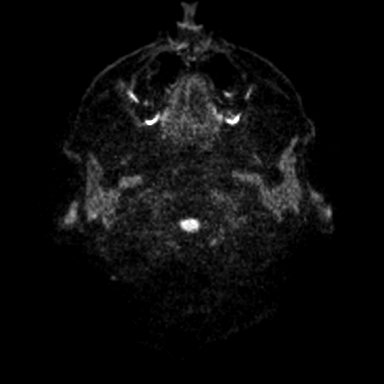
[im 16/48]
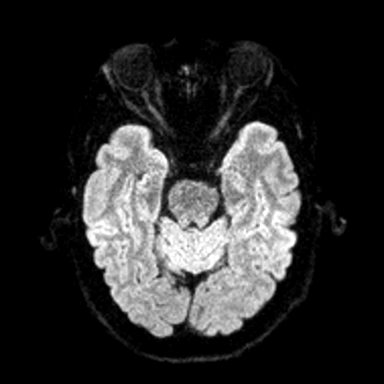
[im 32/48]
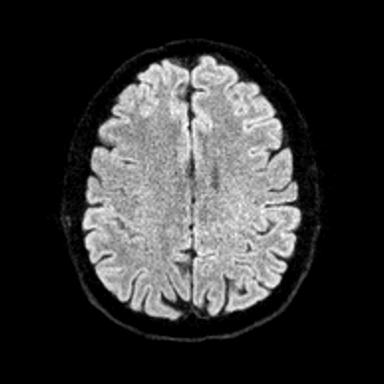
[im 48/48]
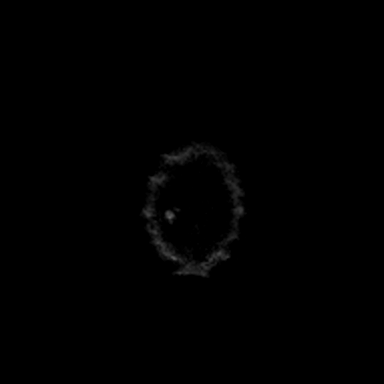

[Series 6: ax dwi_adc · axial · 3.0mm · 0.60mm/px · z∈[-76,+72]mm · 4 of 48 slices shown]
[im 1/48]
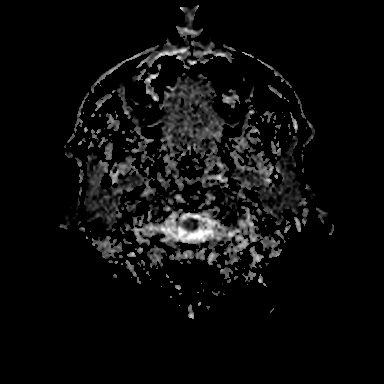
[im 16/48]
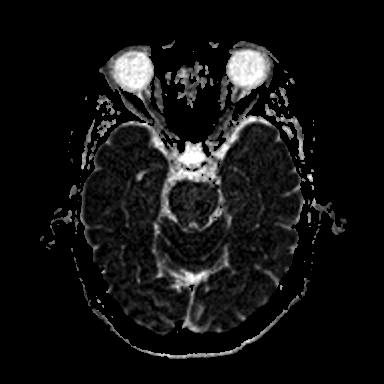
[im 32/48]
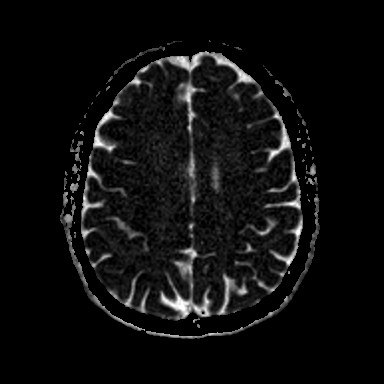
[im 48/48]
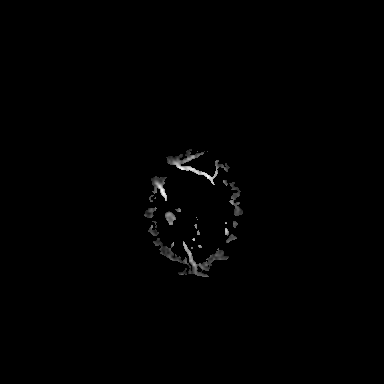

[Series 7: cor dwi_tracew · coronal · 5.0mm · 0.60mm/px · 3 of 38 slices shown]
[im 1/38]
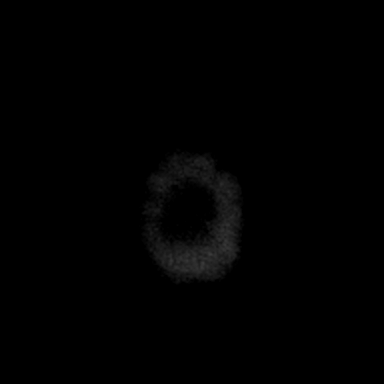
[im 19/38]
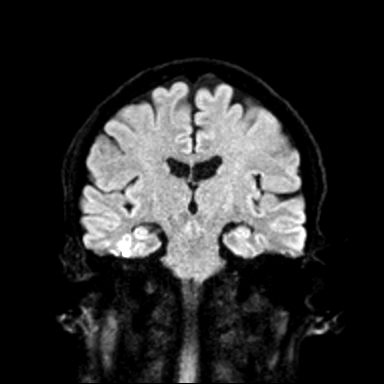
[im 38/38]
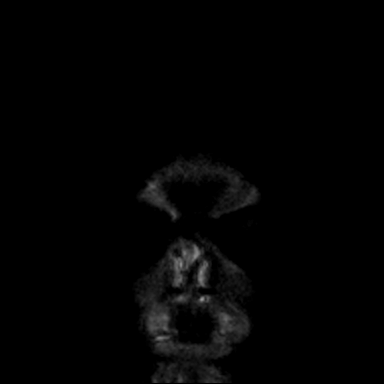

[Series 8: cor dwi_adc · coronal · 5.0mm · 0.60mm/px · 3 of 38 slices shown]
[im 1/38]
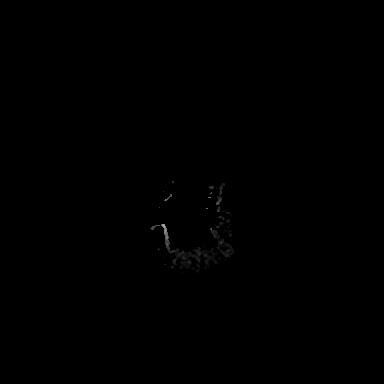
[im 19/38]
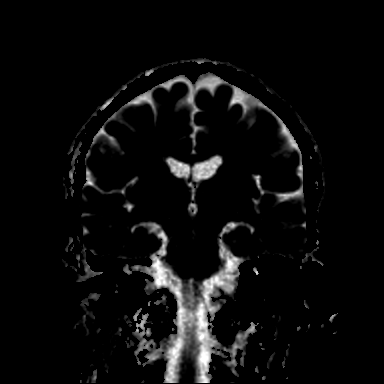
[im 38/38]
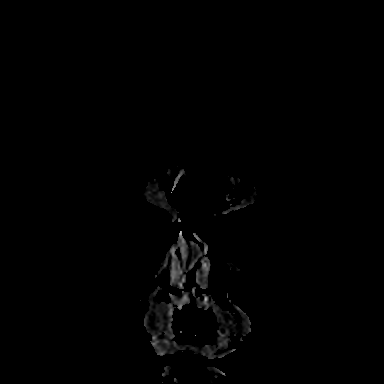

[Series 9: T1 · sagittal · 5.0mm · 0.62mm/px · 2 of 23 slices shown (1 of 2)]
[im 1/23]
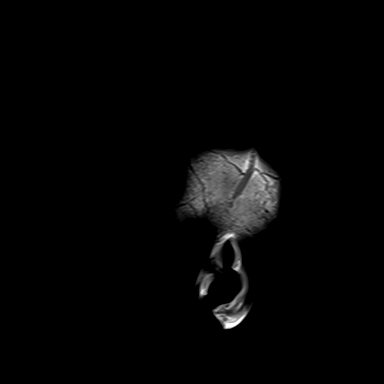
[im 23/23]
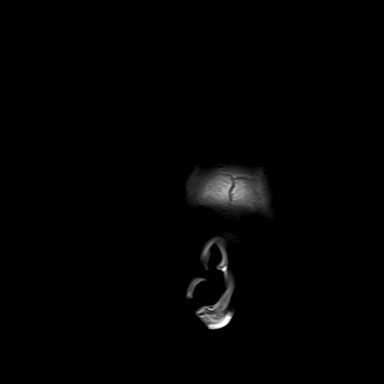

[Series 10: T2 · axial · 5.0mm · 0.53mm/px · z∈[-77,+73]mm · 2 of 27 slices shown (1 of 2)]
[im 1/27]
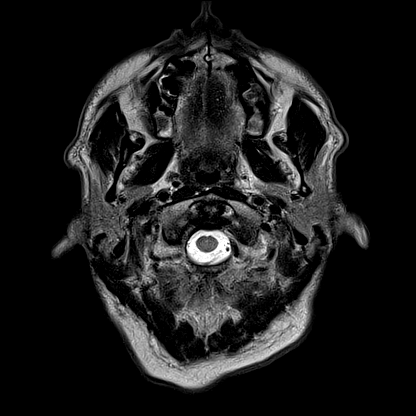
[im 27/27]
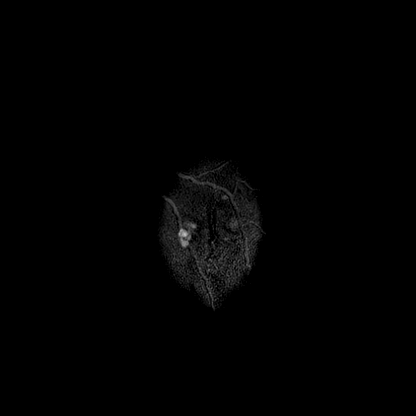

[Series 12: pha_images · axial · 3.0mm · 0.90mm/px · z∈[-80,+76]mm · 5 of 55 slices shown]
[im 1/55]
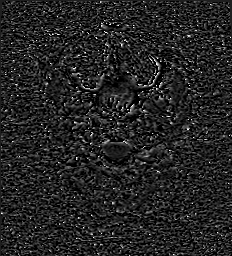
[im 14/55]
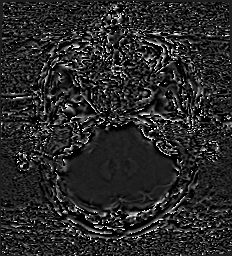
[im 28/55]
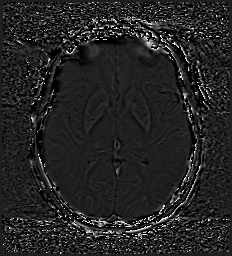
[im 41/55]
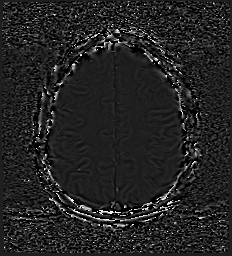
[im 55/55]
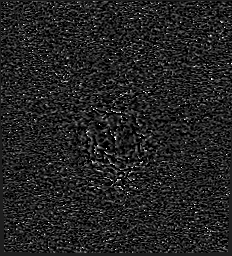

[Series 13: swi_images · axial · 3.0mm · 0.90mm/px · z∈[-80,+79]mm · 5 of 56 slices shown]
[im 1/56]
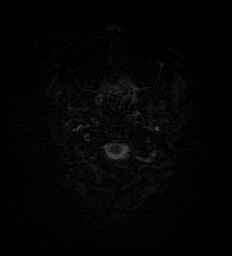
[im 14/56]
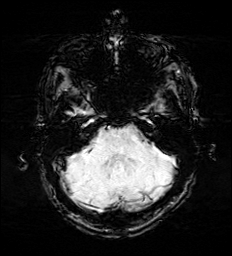
[im 28/56]
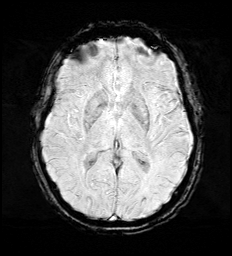
[im 42/56]
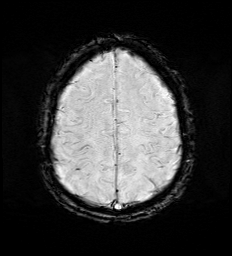
[im 56/56]
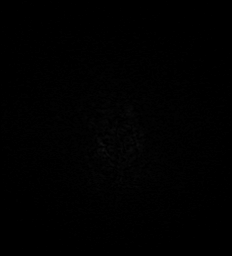

[Series 15: FLAIR · axial · 3.0mm · 0.53mm/px · z∈[-80,+75]mm · 5 of 55 slices shown]
[im 1/55]
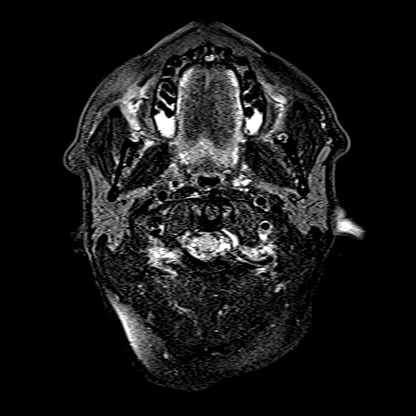
[im 14/55]
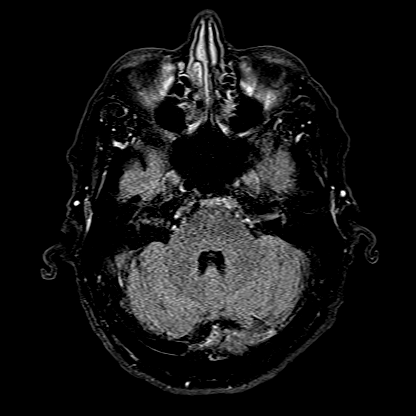
[im 28/55]
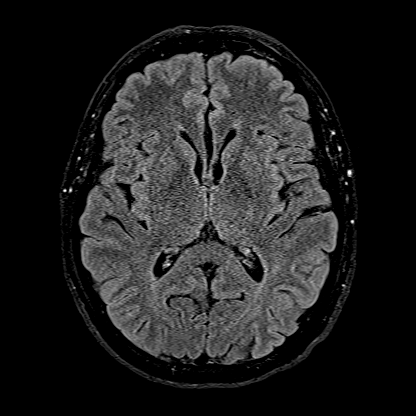
[im 41/55]
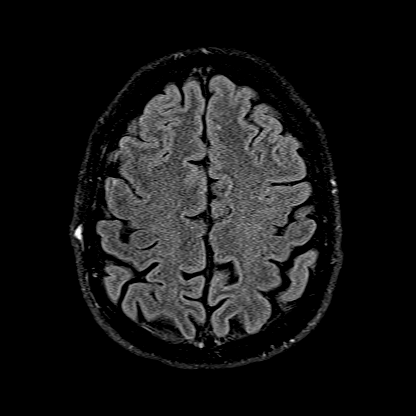
[im 55/55]
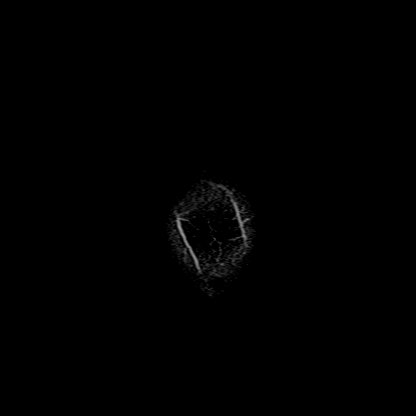

[Series 16: T1 · axial · 1.0mm · 0.98mm/px · z∈[-75,+78]mm · 9 of 160 slices shown (2 of 2)]
[im 1/160]
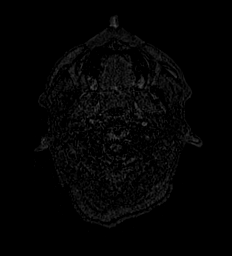
[im 14/160]
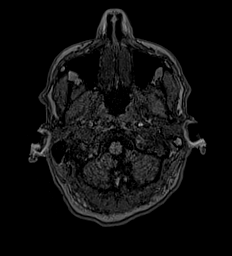
[im 27/160]
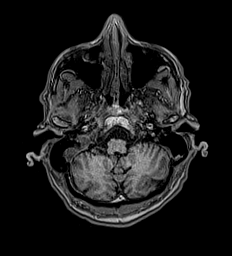
[im 54/160]
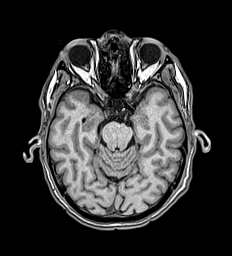
[im 67/160]
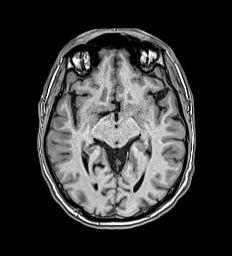
[im 93/160]
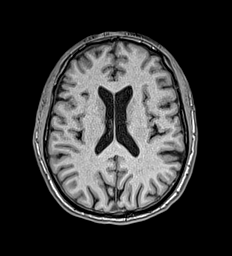
[im 107/160]
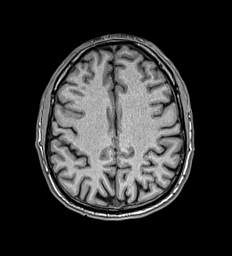
[im 133/160]
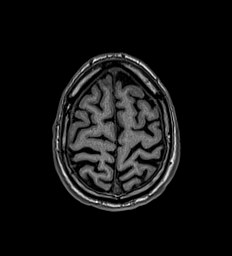
[im 160/160]
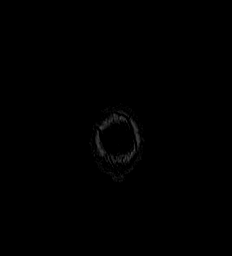

[Series 17: T2 · coronal · 5.0mm · 0.57mm/px · 2 of 29 slices shown (2 of 2)]
[im 1/29]
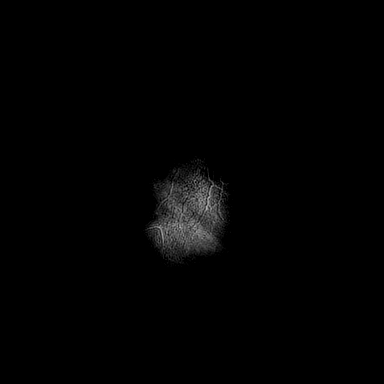
[im 29/29]
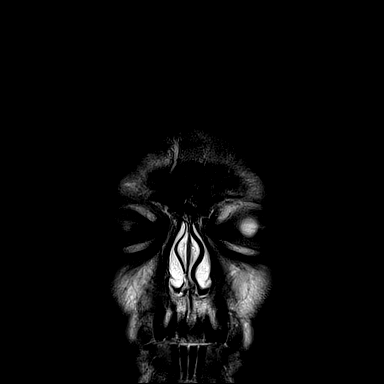

[44 of 48 positions shown; findings below may reference images not displayed]

FINDINGS: Brain: No acute infarct, acute hemorrhage or extra-axial collection.
Normal white matter signal. Normal volume of CSF spaces. No chronic
microhemorrhage. Normal midline structures.

Vascular: Normal flow voids.

Skull and upper cervical spine: Normal marrow signal.

Sinuses/Orbits: Negative.

Other: None.
IMPRESSION: Normal aging brain.
